# Patient Record
Sex: Female | Born: 1965 | Race: White | Hispanic: No | Marital: Married | Smoking: Never smoker
Health system: Southern US, Community
[De-identification: ages and names within clinical notes are randomized; demographics above are authoritative.]

## PROBLEM LIST (undated history)

## (undated) HISTORY — PX: TONSILLECTOMY: SUR1361

---

## 1997-10-01 ENCOUNTER — Inpatient Hospital Stay (HOSPITAL_COMMUNITY): Admission: AD | Admit: 1997-10-01 | Discharge: 1997-10-01 | Payer: Self-pay | Admitting: *Deleted

## 1997-10-23 ENCOUNTER — Observation Stay (HOSPITAL_COMMUNITY): Admission: AD | Admit: 1997-10-23 | Discharge: 1997-10-24 | Payer: Self-pay | Admitting: Obstetrics and Gynecology

## 1998-02-04 ENCOUNTER — Ambulatory Visit (HOSPITAL_COMMUNITY): Admission: RE | Admit: 1998-02-04 | Discharge: 1998-02-04 | Payer: Self-pay | Admitting: Obstetrics and Gynecology

## 1998-03-21 ENCOUNTER — Inpatient Hospital Stay (HOSPITAL_COMMUNITY): Admission: AD | Admit: 1998-03-21 | Discharge: 1998-03-21 | Payer: Self-pay | Admitting: Obstetrics and Gynecology

## 1998-04-11 ENCOUNTER — Inpatient Hospital Stay (HOSPITAL_COMMUNITY): Admission: AD | Admit: 1998-04-11 | Discharge: 1998-04-15 | Payer: Self-pay | Admitting: Obstetrics and Gynecology

## 1998-04-15 ENCOUNTER — Encounter (HOSPITAL_COMMUNITY): Admission: RE | Admit: 1998-04-15 | Discharge: 1998-06-20 | Payer: Self-pay | Admitting: Obstetrics and Gynecology

## 1998-05-22 ENCOUNTER — Other Ambulatory Visit: Admission: RE | Admit: 1998-05-22 | Discharge: 1998-05-22 | Payer: Self-pay | Admitting: Obstetrics and Gynecology

## 1999-03-28 ENCOUNTER — Inpatient Hospital Stay (HOSPITAL_COMMUNITY): Admission: EM | Admit: 1999-03-28 | Discharge: 1999-03-31 | Payer: Self-pay | Admitting: Emergency Medicine

## 1999-05-25 ENCOUNTER — Other Ambulatory Visit: Admission: RE | Admit: 1999-05-25 | Discharge: 1999-05-25 | Payer: Self-pay | Admitting: Obstetrics and Gynecology

## 2000-06-22 ENCOUNTER — Other Ambulatory Visit: Admission: RE | Admit: 2000-06-22 | Discharge: 2000-06-22 | Payer: Self-pay | Admitting: Obstetrics and Gynecology

## 2000-07-25 ENCOUNTER — Ambulatory Visit (HOSPITAL_COMMUNITY): Admission: RE | Admit: 2000-07-25 | Discharge: 2000-07-25 | Payer: Self-pay | Admitting: Obstetrics and Gynecology

## 2001-01-05 ENCOUNTER — Inpatient Hospital Stay (HOSPITAL_COMMUNITY): Admission: AD | Admit: 2001-01-05 | Discharge: 2001-01-08 | Payer: Self-pay | Admitting: Obstetrics and Gynecology

## 2001-01-05 ENCOUNTER — Encounter (INDEPENDENT_AMBULATORY_CARE_PROVIDER_SITE_OTHER): Payer: Self-pay

## 2001-02-08 ENCOUNTER — Other Ambulatory Visit: Admission: RE | Admit: 2001-02-08 | Discharge: 2001-02-08 | Payer: Self-pay | Admitting: Obstetrics and Gynecology

## 2001-05-10 ENCOUNTER — Encounter: Payer: Self-pay | Admitting: Emergency Medicine

## 2001-05-10 ENCOUNTER — Emergency Department (HOSPITAL_COMMUNITY): Admission: EM | Admit: 2001-05-10 | Discharge: 2001-05-10 | Payer: Self-pay | Admitting: Emergency Medicine

## 2001-06-15 ENCOUNTER — Encounter: Admission: RE | Admit: 2001-06-15 | Discharge: 2001-06-15 | Payer: Self-pay | Admitting: Internal Medicine

## 2001-06-15 ENCOUNTER — Encounter: Payer: Self-pay | Admitting: Internal Medicine

## 2001-11-19 ENCOUNTER — Emergency Department (HOSPITAL_COMMUNITY): Admission: EM | Admit: 2001-11-19 | Discharge: 2001-11-19 | Payer: Self-pay | Admitting: Emergency Medicine

## 2001-11-19 ENCOUNTER — Encounter: Payer: Self-pay | Admitting: Emergency Medicine

## 2002-04-04 ENCOUNTER — Other Ambulatory Visit: Admission: RE | Admit: 2002-04-04 | Discharge: 2002-04-04 | Payer: Self-pay | Admitting: Obstetrics and Gynecology

## 2003-07-02 ENCOUNTER — Other Ambulatory Visit: Admission: RE | Admit: 2003-07-02 | Discharge: 2003-07-02 | Payer: Self-pay | Admitting: Obstetrics and Gynecology

## 2005-01-05 ENCOUNTER — Ambulatory Visit (HOSPITAL_COMMUNITY): Admission: RE | Admit: 2005-01-05 | Discharge: 2005-01-05 | Payer: Self-pay | Admitting: Orthopedic Surgery

## 2005-01-05 ENCOUNTER — Ambulatory Visit (HOSPITAL_BASED_OUTPATIENT_CLINIC_OR_DEPARTMENT_OTHER): Admission: RE | Admit: 2005-01-05 | Discharge: 2005-01-05 | Payer: Self-pay | Admitting: Orthopedic Surgery

## 2005-03-24 ENCOUNTER — Other Ambulatory Visit: Admission: RE | Admit: 2005-03-24 | Discharge: 2005-03-24 | Payer: Self-pay | Admitting: Obstetrics and Gynecology

## 2009-02-25 ENCOUNTER — Encounter: Admission: RE | Admit: 2009-02-25 | Discharge: 2009-02-25 | Payer: Self-pay | Admitting: Internal Medicine

## 2010-12-11 ENCOUNTER — Other Ambulatory Visit: Payer: Self-pay | Admitting: Obstetrics and Gynecology

## 2010-12-11 DIAGNOSIS — R928 Other abnormal and inconclusive findings on diagnostic imaging of breast: Secondary | ICD-10-CM

## 2010-12-15 ENCOUNTER — Ambulatory Visit
Admission: RE | Admit: 2010-12-15 | Discharge: 2010-12-15 | Disposition: A | Discharge status: Home / Self Care | Payer: BC Managed Care – PPO | Source: Ambulatory Visit | Attending: Obstetrics and Gynecology | Admitting: Obstetrics and Gynecology

## 2010-12-15 ENCOUNTER — Other Ambulatory Visit: Payer: Self-pay | Admitting: Obstetrics and Gynecology

## 2010-12-15 DIAGNOSIS — R928 Other abnormal and inconclusive findings on diagnostic imaging of breast: Secondary | ICD-10-CM

## 2010-12-17 ENCOUNTER — Other Ambulatory Visit: Payer: Self-pay | Admitting: Diagnostic Radiology

## 2010-12-17 ENCOUNTER — Ambulatory Visit
Admission: RE | Admit: 2010-12-17 | Discharge: 2010-12-17 | Disposition: A | Discharge status: Home / Self Care | Payer: BC Managed Care – PPO | Source: Ambulatory Visit | Attending: Obstetrics and Gynecology | Admitting: Obstetrics and Gynecology

## 2010-12-17 DIAGNOSIS — R928 Other abnormal and inconclusive findings on diagnostic imaging of breast: Secondary | ICD-10-CM
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
## 2011-01-01 NOTE — H&P (Signed)
[facility]  PatientSABENA, [name]                          MRN: [id number] Adm. Date:  01/05/01 Attending:  Juluis Mire, M.D.                         History and Physical                                Patient is a 45 year old gravida 4, para 2-0-1-2 married white female with last menstrual period of August 26 giving her an estimated date of confinement of June 2 and an estimated gestational age of [redacted] weeks.  This is consistent with initial examination as well as early ultrasound.  She has presented now for repeat cesarean section.  In relation to the present admission the patient has had two prior cesarean sections.  The option of trial of labor had been discussed.  This was declined by the patient desiring repeat cesarean section.  Her prenatal course was also complicated by advanced maternal age.  She underwent amniocentesis with the finding of a chromosomally normal female with normal amniotic fluid, alpha fetoprotein screening.  Her 50 g glucola was also negative along with her Group B strep.  No other complications were noted.  It was noted, however, after spinal anesthesia with her last cesarean section she did have some slowly resolving paraesthesias in the leg.  ALLERGIES:                    ERYTHROMYCIN, SURGICAL TAPE, PEANUT BUTTER.  MEDICATIONS:                  Prenatal vitamins.  PRENATAL LABORATORIES:        Patient is O-.  Negative antibody screen. Nonreactive serology.  Negative hepatitis B surface antigen.  Negative 50 g glucola.  Negative group B strep.  PAST MEDICAL HISTORY:         Please see prenatal records.  FAMILY HISTORY:               Please see prenatal records.  SOCIAL HISTORY:               Please see prenatal records.  PHYSICAL EXAMINATION  VITAL SIGNS:                  Patient is afebrile with stable vital signs.  HEENT:                        Patient normocephalic.  Pupils are equal, round and  reactive to light and accommodation.  Extraocular movements are intact. Sclerae and conjunctivae:  Clear.  Oropharynx:  Clear.  NECK:                         Without thyromegaly.  BREASTS:                      Glandular, but no discreet masses.  LUNGS:                        Clear.  CARDIOVASCULAR:               Regular rhythm, rate.  There is a  grade 2/6 systolic ejection murmur.  No clicks or gallops.  This is felt to be a flow murmur.  ABDOMEN:                      Confirms the gravid uterus consistent with dates.  Fetal heart tones are audible.  PELVIC:                       Cervix is long and closed.  EXTREMITIES:                  Trace edema.  NEUROLOGIC:                   Grossly within normal limits.  IMPRESSION:                   Intrauterine pregnancy at term with prior cesarean section, desires repeat.  PLAN:                         Undergo repeat cesarean section.  The risks of surgery have been discussed including the risk of anesthesia, the risk of infection, the risk of hemorrhage that could necessitate transfusion with the risk of AIDS or hepatitis, the risk of injury to adjacent organs including bladder, bowel, or ureters that could require further exploratory surgery or management, the risk of deep venous thrombosis or pulmonary emboli.  Patient expressed understanding of indications and risks. DD:  01/05/01 TD:  01/05/01 Job: [id number]

## 2011-01-01 NOTE — Discharge Summary (Signed)
[facility]  Patient:    [name], [name]                       MRN: [id number] Adm. Date:  [id number] Disc. Date: [id number] Attending:  Frederich Balding Dictator:   Danie Chandler, R.N.                           Discharge Summary  ADMISSION DIAGNOSES:          1. Intrauterine pregnancy at term with a                                  prior cesarean section, desires repeat.                               2. Multiparity, desires sterility.  DISCHARGE DIAGNOSES:          1. Intrauterine pregnancy at term with a                                  prior cesarean section, desires repeat.                               2. Multiparity, desires sterility.                               3. Anemia.  PROCEDURE:                    On Jan 05, 2001: Repeat low transverse cesarean section and bilateral tubal ligation.  REASON FOR ADMISSION:          Please see dictated H&P.  HOSPITAL COURSE:              The patient was taken to the operating room and underwent the above-named procedure without complication. This was productive of a viable female infant with Apgars of 9 at one minute and 9 at five minutes. Postoperatively, on day #1, the patients hemoglobin was 8.4, hematocrit 24.9, and white blood cell count 10.8. The patient was started on iron daily, had a repeat CBC ordered for the following day. On postoperative day #1, the patient had good control of pain and on postoperative day #2, the patients hemoglobin was stable at 7.9. Her platelets were 149,00 so those were rechecked on the following day. The patient was tolerating a regular diet on this day and ambulating without difficulty. On postoperative day #3, she had a good return of bowel function. Her platelets were 177,000 and hemoglobin was stable at 7.8 and she was discharged home on this day.  CONDITION ON DISCHARGE:       Good.  DIET:                         Regular as tolerated.  ACTIVITY:                      No heavy lifting, no driving, no vaginal entry.  DISCHARGE FOLLOWUP:           She is to follow up  in the office in one to two weeks for incision check and she is to call for temperature greater than 100 degrees, persistent nausea or vomiting, heavy vaginal bleeding, and/or redness or drainage from the incision site.  DISCHARGE MEDICATIONS:        1. Prenatal vitamin one p.o. q.d.                               2. Percocet, #30, as directed by M.D.                               3. Motrin, #30, as directed by M.D.                               4. Nu-Iron, #20, as directed by M.D.DD:  02/06/01 TD:  02/06/01 Job: [id number]

## 2011-01-01 NOTE — Op Note (Signed)
[facility]  Patient:    [name], [name]                       MRN: [id number] Proc. Date: 01/05/01 Adm. Date:  [id number] Attending:  Frederich Balding                           Operative Report  PREOPERATIVE DIAGNOSES:       1. Intrauterine pregnancy at term with prior                                  cesarean section, desires repeat.                               2. Multiparity, desires sterility.  POSTOPERATIVE DIAGNOSES:      1. Intrauterine pregnancy at term with prior                                  cesarean section, desires repeat.                               2. Multiparity, desires sterility.  OPERATION:                    1. Repeat low transverse cesarean section.                               2. Bilateral tubal ligation.  SURGEON:                      Juluis Mire, M.D.  ANESTHESIA:                   Epidural.  ESTIMATED BLOOD LOSS:         800 cc.  PACKS AND DRAINS:             None.  INTRAOPERATIVE BLOOD REPLACED: None.  COMPLICATIONS:                None.  INDICATIONS:                  Were dictated in History & Physical.  In addition, the patient did notify us that she was desirous of permanent sterilization.  The potential irreversibility of sterilization was discussed. We had discussed alternatives.  A failure rate of 1 in 200 was quoted. Failures can be in the form of ectopic pregnancies.  DESCRIPTION OF PROCEDURE:     The patient was taken to the operating room and placed in supine position with left lateral tilt.  After acceptable level of epidural anesthesia was obtained, the abdomen was prepped out with Betadine and draped in sterile field.  A prior low transverse skin incision was identified and excised.  The incision was then extended through subcutaneous tissue.  The fascia was entered sharply, and the incision in the fascia was extended laterally.  The rectus muscles were separated in the midline.   The peritoneum was entered sharply, and the incision in the peritoneum was extended both superiorly and inferiorly.   Some omental adhesions were noted and  taken down using the bipolar.  We had good hemostasis.  Low transverse bladder flap was developed.  Low transverse uterine incision was begun with knife and extended laterally using manual retraction.  The infant was in the vertex presentation and was delivered with elevation of head and fundal pressure.  The infant was a viable female who weighed 6 pounds 14 ounces. Apgars were 8/9, umbilical artery pH was 7.28.  The placenta was then delivered manually.  The uterus was wiped free of remaining membranes and placenta.  The uterus was then exteriorized.  The uterus was was closed with interlocking sutures of 0 chromic using a two layer closure technique.   Some bleeding was noted from the left side of the uterine incision and brought under control with several OLeary stitches of 0 chromic.  Continued ooze was brought under control with some 2-0 chromic.  With this, we had good hemostasis.  Urine output remained clear and adequate.  Both tubes were identified.  The mid segment of tube was elevated with a Babcock.  A hole was made avascular in mesosalpinx using the bipolar.  Individual ligatures of 0 plain catgut were used to ligate off the segment of tube.  The intervening segment of tube was excised.  The cut ends of the tubes were cauterized using the Bovie.  With this, we had good separation of each tube and good hemostasis.  Ovaries were unremarkable.  The uterus was returned to the abdominal cavity.  We had excellent hemostasis and clear urine output.  The muscles were reapproximated with running suture of 3-0 Vicryl.  Fascia was closed with running suture of 0 Panacryl.  Skin was closed with staples and Steri-Strips.  Sponge, instrument, and needle count was reported correct by circulating nurse x 2.  The patient tolerated the  procedure well and was returned to the recovery room in good condition. DD:  01/05/01 TD:  01/05/01 Job: [id number]

## 2011-07-27 ENCOUNTER — Encounter (HOSPITAL_BASED_OUTPATIENT_CLINIC_OR_DEPARTMENT_OTHER): Payer: Self-pay

## 2011-07-27 ENCOUNTER — Emergency Department (INDEPENDENT_AMBULATORY_CARE_PROVIDER_SITE_OTHER): Payer: BC Managed Care – PPO

## 2011-07-27 ENCOUNTER — Emergency Department (HOSPITAL_BASED_OUTPATIENT_CLINIC_OR_DEPARTMENT_OTHER)
Admission: EM | Admit: 2011-07-27 | Discharge: 2011-07-27 | Disposition: A | Discharge status: Home / Self Care | Payer: BC Managed Care – PPO | Attending: Emergency Medicine | Admitting: Emergency Medicine

## 2011-07-27 ENCOUNTER — Other Ambulatory Visit: Payer: Self-pay

## 2011-07-27 DIAGNOSIS — J9819 Other pulmonary collapse: Secondary | ICD-10-CM

## 2011-07-27 DIAGNOSIS — R071 Chest pain on breathing: Secondary | ICD-10-CM

## 2011-07-27 DIAGNOSIS — R079 Chest pain, unspecified: Secondary | ICD-10-CM | POA: Insufficient documentation

## 2011-07-27 DIAGNOSIS — R42 Dizziness and giddiness: Secondary | ICD-10-CM | POA: Insufficient documentation

## 2011-07-27 DIAGNOSIS — M542 Cervicalgia: Secondary | ICD-10-CM

## 2011-07-27 DIAGNOSIS — R0602 Shortness of breath: Secondary | ICD-10-CM | POA: Insufficient documentation

## 2011-07-27 DIAGNOSIS — M79609 Pain in unspecified limb: Secondary | ICD-10-CM

## 2011-07-27 DIAGNOSIS — R0789 Other chest pain: Secondary | ICD-10-CM

## 2011-07-27 LAB — COMPREHENSIVE METABOLIC PANEL
ALT: 10 U/L (ref 0–35)
Alkaline Phosphatase: 85 U/L (ref 39–117)
Chloride: 104 mEq/L (ref 96–112)
GFR calc Af Amer: >90 mL/min (ref >90)
Glucose, Bld: 93 mg/dL (ref 70–99)
Potassium: 3.7 mEq/L (ref 3.5–5.1)
Sodium: 139 mEq/L (ref 135–145)
Total Bilirubin: 0.3 mg/dL (ref 0.3–1.2)
Total Protein: 7.8 g/dL (ref 6.0–8.3)

## 2011-07-27 LAB — CBC
Hemoglobin: 13.8 g/dL (ref 12.0–15.0)
MCHC: 34.2 g/dL (ref 30.0–36.0)
RBC: 4.53 MIL/uL (ref 3.87–5.11)
WBC: 7.8 10*3/uL (ref 4.0–10.5)

## 2011-07-27 MED ORDER — SODIUM CHLORIDE 0.9 % IV SOLN
20.0000 mL | INTRAVENOUS | Status: DC
  Administered 2011-07-27: 20 mL via INTRAVENOUS

## 2011-07-27 MED ORDER — IOHEXOL 350 MG/ML SOLN
80.0000 mL | Freq: Once | INTRAVENOUS | Status: AC | PRN
  Administered 2011-07-27: 80 mL via INTRAVENOUS

## 2011-07-27 MED ORDER — ASPIRIN 81 MG PO CHEW
324.0000 mg | CHEWABLE_TABLET | Freq: Once | ORAL | Status: AC
  Administered 2011-07-27: 324 mg via ORAL
  Filled 2011-07-27: qty 4

## 2011-07-27 MED ORDER — ETODOLAC 300 MG PO CAPS
300.0000 mg | ORAL_CAPSULE | Freq: Three times a day (TID) | ORAL | Status: DC
Start: 2011-07-27 — End: 2011-07-27

## 2011-07-27 MED ORDER — ETODOLAC 300 MG PO CAPS: 300.0000 mg | ORAL_CAPSULE | Freq: Three times a day (TID) | ORAL | Status: AC

## 2011-07-27 NOTE — ED Notes (Signed)
No diatress noted in Pt. And no ectopy noted in cardiac monitor.. SR with 81 rate

## 2011-07-27 NOTE — ED Notes (Signed)
Pt reports onset of left chest wall pain radiating to left side of neck that started yesterday.  Pain has worsened today.

## 2011-07-27 NOTE — ED Provider Notes (Signed)
History     CSN: [id number] Arrival date & time: 07/27/2011  3:19 PM   First MD Initiated Contact with Patient 07/27/11 1622      Chief Complaint  Patient presents with  . Chest Pain    (Consider location/radiation/quality/duration/timing/severity/associated sxs/prior treatment) HPI Comments: The pain is on the upper left and goes up toward the neck.  Patient is a 45 y.o. female presenting with chest pain. The history is provided by the patient.  Chest Pain The chest pain began yesterday. Chest pain occurs constantly. The chest pain is worsening. The pain is associated with breathing and exertion. The severity of the pain is moderate. The quality of the pain is described as pleuritic, sharp and pressure-like. Chest pain is worsened by deep breathing. Primary symptoms include shortness of breath and dizziness. Pertinent negatives for primary symptoms include no fever, no cough, no palpitations and no vomiting.  Dizziness does not occur with vomiting, weakness or diaphoresis.   Pertinent negatives for associated symptoms include no diaphoresis, no numbness and no weakness. She tried aspirin for the symptoms. Risk factors include no known risk factors.  Pertinent negatives for past medical history include no CAD.  Her family medical history is significant for CAD in family.  Pertinent negatives for family medical history include: no early MI in family and no PE in family.     History reviewed. No pertinent past medical history.  Past Surgical History  Procedure Date  . Cesarean section     No family history on file.  History  Substance Use Topics  . Smoking status: Never Smoker   . Smokeless tobacco: Not on file  . Alcohol Use: Yes     occasional    OB History    Grav Para Term Preterm Abortions TAB SAB Ect Mult Living                  Review of Systems  Constitutional: Negative for fever and diaphoresis.  Respiratory: Positive for shortness of breath. Negative for  cough.   Cardiovascular: Positive for chest pain. Negative for palpitations.  Gastrointestinal: Negative for vomiting.  Neurological: Positive for dizziness. Negative for weakness and numbness.  All other systems reviewed and are negative.    Allergies  Erythromycin; Peanut-containing drug products; and Tape  Home Medications   Current Outpatient Rx  Name Route Sig Dispense Refill  . ASPIRIN EC 81 MG PO TBEC Oral Take 162 mg by mouth daily.        BP 117/77  Pulse 78  Temp(Src) 98.6 F (37 C) (Oral)  Resp 18  Ht 5\' 5"  (1.651 m)  Wt 206 lb (93.441 kg)  BMI 34.28 kg/m2  SpO2 100%  LMP 07/20/2011  Physical Exam  Nursing note and vitals reviewed. Constitutional: She appears well-developed and well-nourished. No distress.  HENT:  Head: Normocephalic and atraumatic.  Right Ear: External ear normal.  Left Ear: External ear normal.  Eyes: Conjunctivae are normal. Right eye exhibits no discharge. Left eye exhibits no discharge. No scleral icterus.  Neck: Neck supple. No tracheal deviation present.  Cardiovascular: Normal rate, regular rhythm and intact distal pulses.   Pulmonary/Chest: Effort normal and breath sounds normal. No stridor. No respiratory distress. She has no wheezes. She has no rales.  Abdominal: Soft. Bowel sounds are normal. She exhibits no distension. There is tenderness (left chest wall ttp). There is no rebound and no guarding.  Musculoskeletal: She exhibits no edema and no tenderness.  Neurological: She is alert. She has  normal strength. No sensory deficit. Cranial nerve deficit:  no gross defecits noted. She exhibits normal muscle tone. She displays no seizure activity. Coordination normal.  Skin: Skin is warm and dry. No rash noted.  Psychiatric: She has a normal mood and affect.    ED Course  Procedures (including critical care time)  Date: 07/27/2011  Rate: 78  Rhythm: normal sinus rhythm  QRS Axis: normal  Intervals: normal  ST/T Wave  abnormalities: normal  Conduction Disutrbances:none  Narrative Interpretation:   Old EKG Reviewed: none available   Labs Reviewed  COMPREHENSIVE METABOLIC PANEL - Abnormal; Notable for the following:    GFR calc non Af Amer 88 (*)    All other components within normal limits  D-DIMER, QUANTITATIVE - Abnormal; Notable for the following:    D-Dimer, Quant 0.49 (*)    All other components within normal limits  CBC  TROPONIN I   Dg Chest 2 View  07/27/2011  *RADIOLOGY REPORT*  Clinical Data: Left-sided chest pain radiates into the left side of neck and into the left arm.  CHEST - 2 VIEW  Comparison: None.  Findings: Heart size is normal.  There is perihilar atelectasis/infiltrate and bronchitic change.  Left lower lobe atelectasis is present.  There are no focal consolidations or pleural effusions. Visualized osseous structures have a normal appearance.  IMPRESSION: Findings are consistent with viral pneumonitis.  Left lower lobe atelectasis.  Original Report Authenticated By: Patterson Hammersmith, M.D.   Ct Angio Chest W/cm &/or Wo Cm  07/27/2011  *RADIOLOGY REPORT*  Clinical Data:  Left-sided chest pain.  Elevated D-dimer.  CT ANGIOGRAPHY CHEST WITH CONTRAST  Technique:  Multidetector CT imaging of the chest was performed using the standard protocol during bolus administration of intravenous contrast.  Multiplanar CT image reconstructions including MIPs were obtained to evaluate the vascular anatomy.  Contrast: 80mL OMNIPAQUE IOHEXOL 350 MG/ML IV SOLN  Comparison:  plain films earlier today.  Findings:  No filling defects in the pulmonary arteries to suggest pulmonary emboli. Heart is normal size. Aorta is normal caliber. No mediastinal, hilar, or axillary adenopathy.  Slight central peribronchial thickening.  Scattered ground-glass opacities noted in the lingula and dependent lower lobes, presumably atelectasis. No pleural effusions.  Imaging into the upper abdomen shows no acute findings.   Visualized thyroid and chest wall soft tissues unremarkable.  Review of the MIP images confirms the above findings.  IMPRESSION: No evidence of pulmonary embolus.  Slight central peribronchial thickening. Scattered areas of atelectasis.  Original Report Authenticated By: Cyndie Chime, M.D.     1. Chest pain       MDM  Patient is low risk for coronary artery disease. Her symptoms are atypical. The CT scan does not show any signs of pulmonary embolus, dissection.  The symptoms may be related to pleurisy. Patient has been given a prescription for a nonsteroidal anti-inflammatory. She is to follow up with her primary care Dr. for recheck within the next week. Patient has been encouraged to return emergently for recurrent symptoms        Celene Kras, MD 07/28/11 [phone]

## 2011-07-27 NOTE — ED Notes (Signed)
No resp. Distress noted and no other distress noted by pt.

## 2013-03-04 IMAGING — CT CT ANGIO CHEST
2 of 6 series · 19 of 36 positions shown · IV contrast (APPLIED)
Comparison: plain films earlier today.

CLINICAL DATA: Left-sided chest pain.  Elevated D-dimer.

CT ANGIOGRAPHY CHEST WITH CONTRAST
TECHNIQUE: Multidetector CT imaging of the chest was performed
using the standard protocol during bolus administration of
intravenous contrast.  Multiplanar CT image reconstructions
including MIPs were obtained to evaluate the vascular anatomy.
Contrast: 80mL OMNIPAQUE IOHEXOL 350 MG/ML IV SOLN

[Series 5: pe 1.0 b25f · axial · 0.58mm/px · z∈[+10,+176]mm · 18 of 186 slices shown]
[im 10/186  lung]
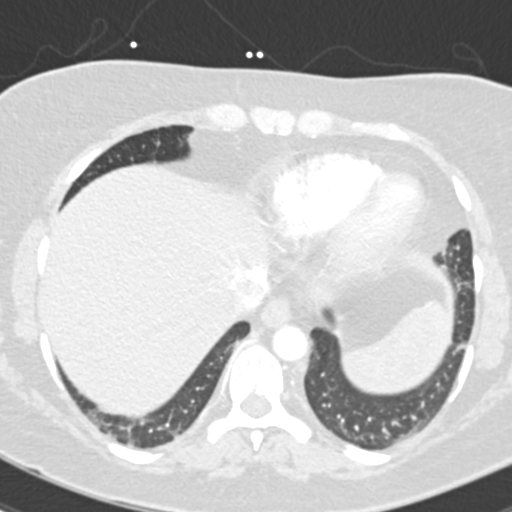
[im 19/186  mediastinal]
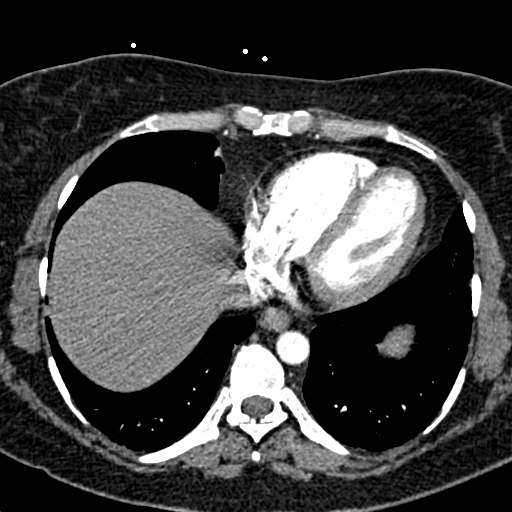
[im 28/186  lung]
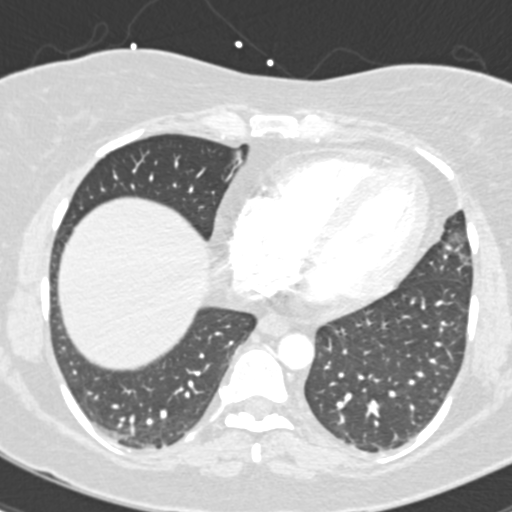
[im 38/186  mediastinal]
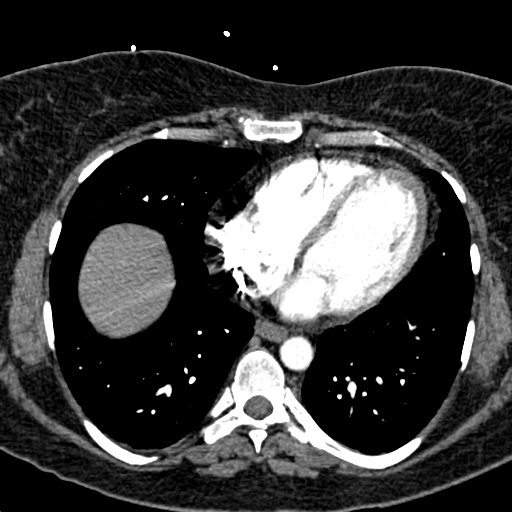
[im 47/186  lung]
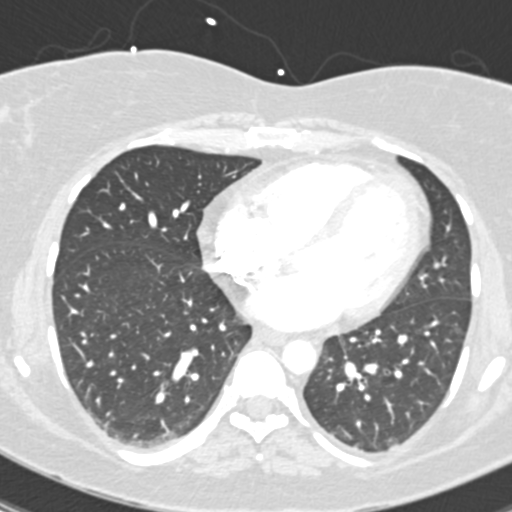
[im 56/186  mediastinal]
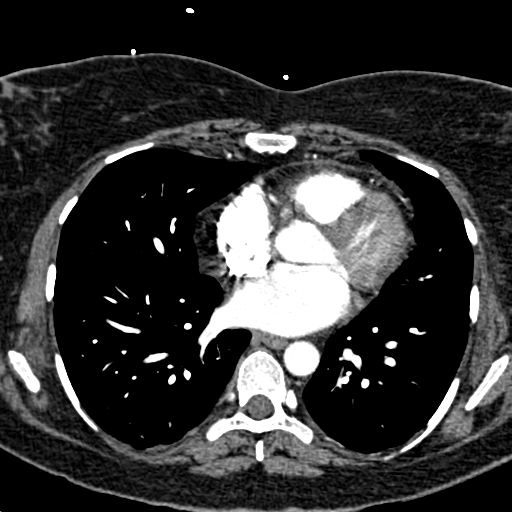
[im 65/186  lung]
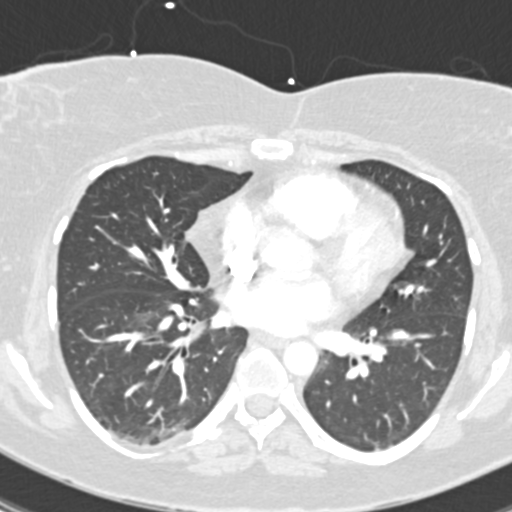
[im 75/186  mediastinal]
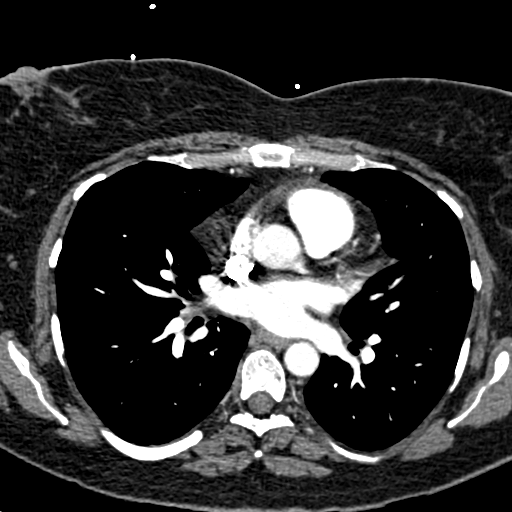
[im 84/186  lung]
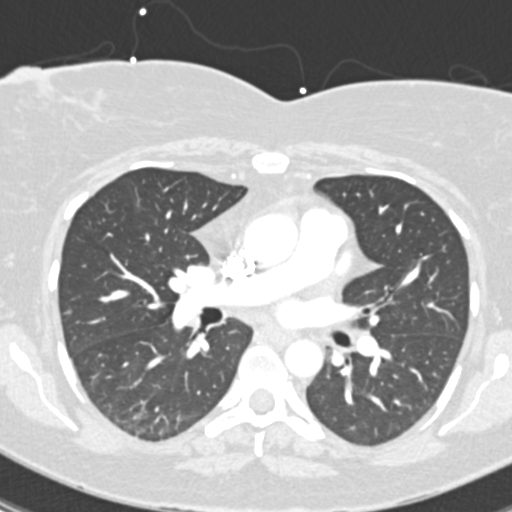
[im 102/186  mediastinal]
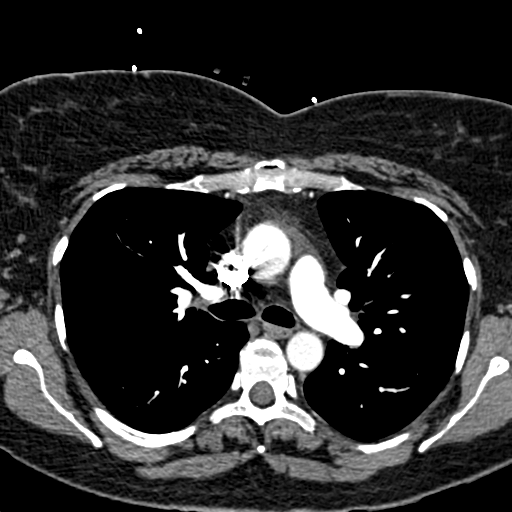
[im 112/186  lung]
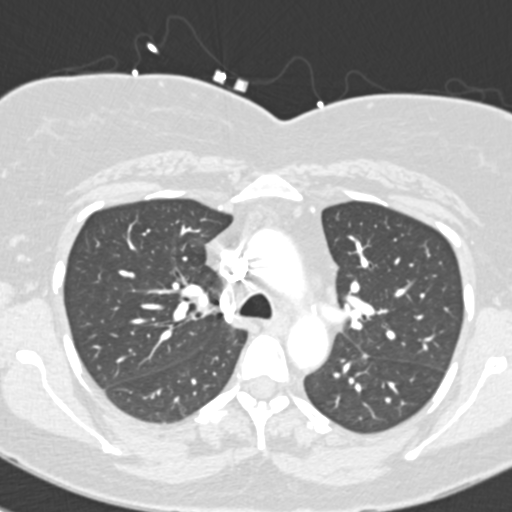
[im 121/186  mediastinal]
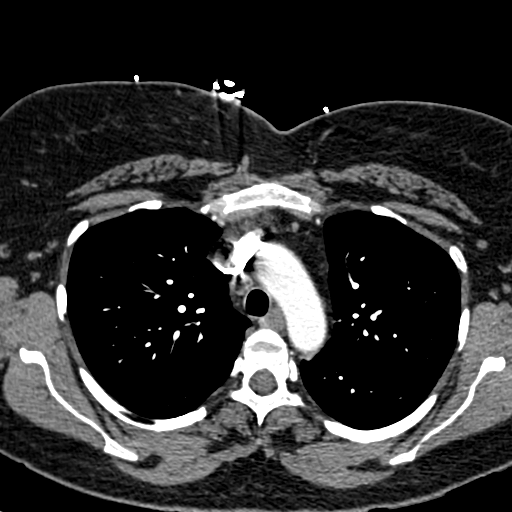
[im 130/186  lung]
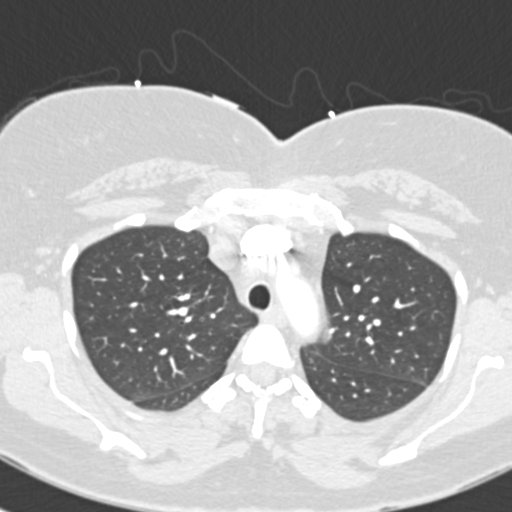
[im 139/186  mediastinal]
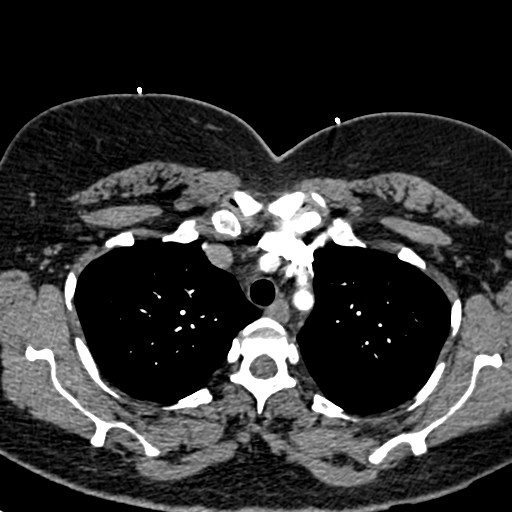
[im 149/186  lung]
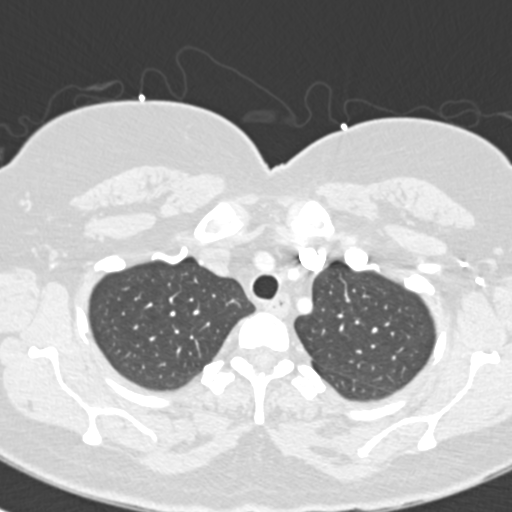
[im 158/186  mediastinal]
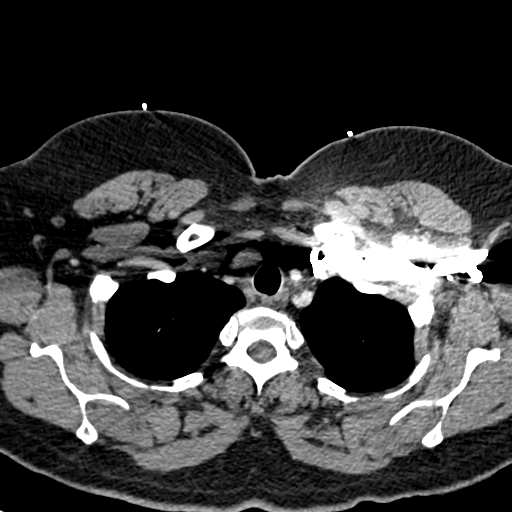
[im 167/186  lung]
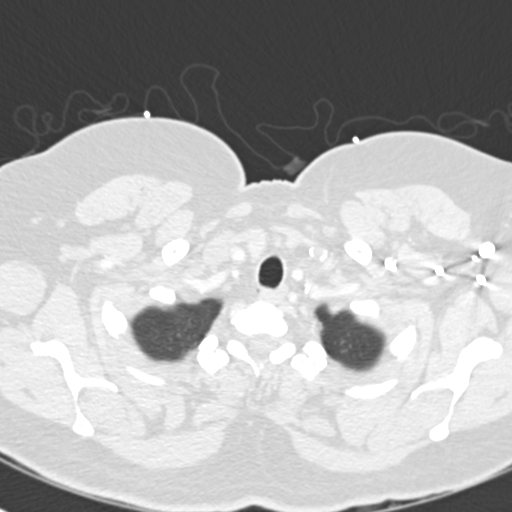
[im 176/186  mediastinal]
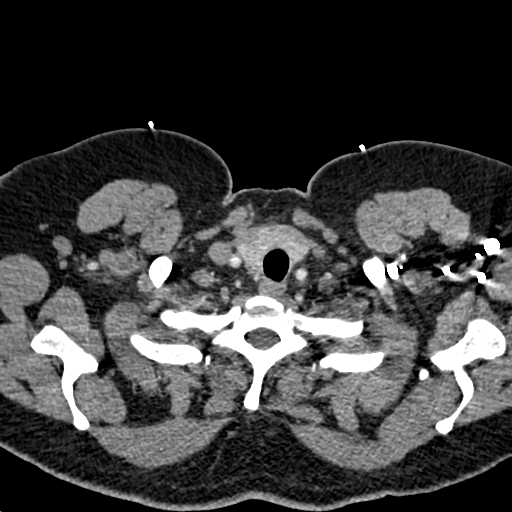

[Series 8: pe 2.0 coronal · coronal · 0.37mm/px · 1 of 115 slices shown]
[im 58/115  mediastinal]
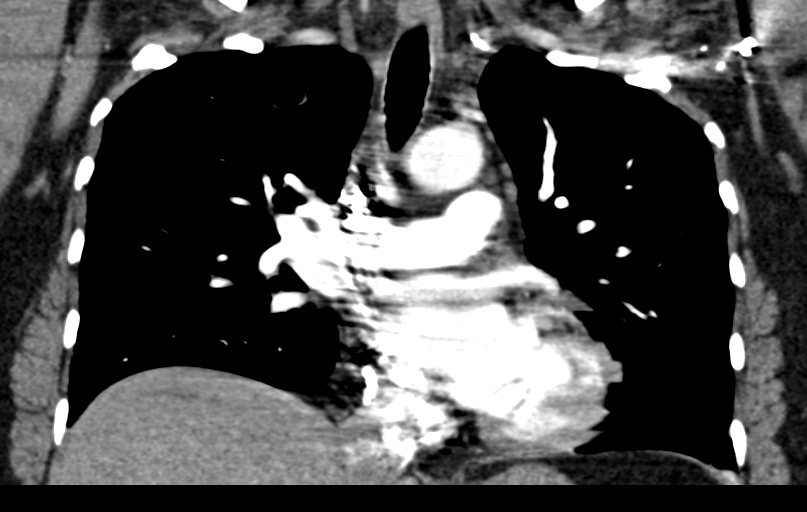

[19 of 36 positions shown; findings below may reference images not displayed]

FINDINGS: No filling defects in the pulmonary arteries to suggest
pulmonary emboli. Heart is normal size. Aorta is normal caliber. No
mediastinal, hilar, or axillary adenopathy.  Slight central
peribronchial thickening.  Scattered ground-glass opacities noted
in the lingula and dependent lower lobes, presumably atelectasis.
No pleural effusions.

Imaging into the upper abdomen shows no acute findings.  Visualized
thyroid and chest wall soft tissues unremarkable.

Review of the MIP images confirms the above findings.
IMPRESSION: No evidence of pulmonary embolus.

Slight central peribronchial thickening. Scattered areas of
atelectasis.

## 2014-06-27 ENCOUNTER — Encounter (HOSPITAL_BASED_OUTPATIENT_CLINIC_OR_DEPARTMENT_OTHER): Payer: Self-pay | Admitting: *Deleted

## 2014-06-27 ENCOUNTER — Emergency Department (HOSPITAL_BASED_OUTPATIENT_CLINIC_OR_DEPARTMENT_OTHER)
Admission: EM | Admit: 2014-06-27 | Discharge: 2014-06-27 | Disposition: A | Discharge status: Home / Self Care | Payer: Worker's Compensation | Attending: Emergency Medicine | Admitting: Emergency Medicine

## 2014-06-27 ENCOUNTER — Emergency Department (HOSPITAL_BASED_OUTPATIENT_CLINIC_OR_DEPARTMENT_OTHER): Payer: Worker's Compensation

## 2014-06-27 DIAGNOSIS — W050XXA Fall from non-moving wheelchair, initial encounter: Secondary | ICD-10-CM | POA: Insufficient documentation

## 2014-06-27 DIAGNOSIS — Y9389 Activity, other specified: Secondary | ICD-10-CM | POA: Diagnosis not present

## 2014-06-27 DIAGNOSIS — S39012A Strain of muscle, fascia and tendon of lower back, initial encounter: Secondary | ICD-10-CM | POA: Diagnosis not present

## 2014-06-27 DIAGNOSIS — W19XXXA Unspecified fall, initial encounter: Secondary | ICD-10-CM

## 2014-06-27 DIAGNOSIS — Z7982 Long term (current) use of aspirin: Secondary | ICD-10-CM | POA: Diagnosis not present

## 2014-06-27 DIAGNOSIS — S46911A Strain of unspecified muscle, fascia and tendon at shoulder and upper arm level, right arm, initial encounter: Secondary | ICD-10-CM

## 2014-06-27 DIAGNOSIS — Y998 Other external cause status: Secondary | ICD-10-CM | POA: Diagnosis not present

## 2014-06-27 DIAGNOSIS — S50811A Abrasion of right forearm, initial encounter: Secondary | ICD-10-CM | POA: Insufficient documentation

## 2014-06-27 DIAGNOSIS — Y9289 Other specified places as the place of occurrence of the external cause: Secondary | ICD-10-CM | POA: Insufficient documentation

## 2014-06-27 DIAGNOSIS — Z791 Long term (current) use of non-steroidal anti-inflammatories (NSAID): Secondary | ICD-10-CM | POA: Insufficient documentation

## 2014-06-27 DIAGNOSIS — S3992XA Unspecified injury of lower back, initial encounter: Secondary | ICD-10-CM | POA: Diagnosis present

## 2014-06-27 MED ORDER — HYDROCODONE-ACETAMINOPHEN 5-325 MG PO TABS
1.0000 | ORAL_TABLET | Freq: Once | ORAL | Status: DC
  Filled 2014-06-27: qty 1

## 2014-06-27 MED ORDER — IBUPROFEN 800 MG PO TABS
800.0000 mg | ORAL_TABLET | Freq: Once | ORAL | Status: AC
  Administered 2014-06-27: 800 mg via ORAL
  Filled 2014-06-27: qty 1

## 2014-06-27 MED ORDER — OXYCODONE-ACETAMINOPHEN 5-325 MG PO TABS: 2.0000 | ORAL_TABLET | ORAL | Status: AC | PRN

## 2014-06-27 MED ORDER — HYDROCODONE-ACETAMINOPHEN 5-325 MG PO TABS: 2.0000 | ORAL_TABLET | ORAL | Status: DC | PRN

## 2014-06-27 MED ORDER — IBUPROFEN 800 MG PO TABS: 800.0000 mg | ORAL_TABLET | Freq: Three times a day (TID) | ORAL | Status: AC

## 2014-06-27 MED ORDER — CYCLOBENZAPRINE HCL 10 MG PO TABS: 10.0000 mg | ORAL_TABLET | Freq: Two times a day (BID) | ORAL | Status: AC | PRN
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
## 2014-06-27 NOTE — ED Provider Notes (Signed)
CSN: [id number]     Arrival date & time 06/27/14  1016 History   First MD Initiated Contact with Patient 06/27/14 1152     Chief Complaint  Patient presents with  . Fall     (Consider location/radiation/quality/duration/timing/severity/associated sxs/prior Treatment) HPI  The patient went to sit in a chair and it slipped out from behind her. She fell backwards trying to catch herself with her right arm on a windowsill. She ended up landing on her right flank/buttock area. She port initially she didn't think the injury was too bad. However when she tried to stand up she had a lot of pain in her right lower back. She denies numbness or tingling into the leg. She denies weakness of the leg.she was her right shoulder hurts when she extends her arm backwards. There is no numbness or tingling or dysfunction of right upper shoulder. She did not hit her head or get knocked out.  History reviewed. No pertinent past medical history. Past Surgical History  Procedure Laterality Date  . Cesarean section     History reviewed. No pertinent family history. History  Substance Use Topics  . Smoking status: Never Smoker   . Smokeless tobacco: Not on file  . Alcohol Use: Yes     Comment: occasional   OB History    No data available     Review of Systems  10 Systems reviewed and are negative for acute change except as noted in the HPI.   Allergies  Erythromycin; Peanut-containing drug products; and Tape  Home Medications   Prior to Admission medications   Medication Sig Start Date End Date Taking? Authorizing Provider  meloxicam (MOBIC) 15 MG tablet Take 15 mg by mouth daily.   Yes Historical Provider, MD  aspirin EC 81 MG tablet Take 162 mg by mouth daily.      Historical Provider, MD  cyclobenzaprine (FLEXERIL) 10 MG tablet Take 1 tablet (10 mg total) by mouth 2 (two) times daily as needed for muscle spasms. 06/27/14   Arby BarretteMarcy Moises Terpstra, MD  ibuprofen (ADVIL,MOTRIN) 800 MG tablet Take 1  tablet (800 mg total) by mouth 3 (three) times daily. 06/27/14   Arby BarretteMarcy Samhitha Rosen, MD  oxyCODONE-acetaminophen (PERCOCET/ROXICET) 5-325 MG per tablet Take 2 tablets by mouth every 4 (four) hours as needed for severe pain. 06/27/14   Arby BarretteMarcy Brystal Kildow, MD   BP 127/85 mmHg  Pulse 72  Temp(Src) 98.3 F (36.8 C) (Oral)  Resp 18  Ht 5\' 5"  (1.651 m)  Wt 220 lb (99.791 kg)  BMI 36.61 kg/m2  SpO2 100%  LMP 02/13/2014 (Approximate) Physical Exam  Constitutional: She is oriented to person, place, and time. She appears well-developed and well-nourished.  HENT:  Head: Normocephalic and atraumatic.  Eyes: EOM are normal. Pupils are equal, round, and reactive to light.  Neck: Neck supple.  Cardiovascular: Normal rate, regular rhythm, normal heart sounds and intact distal pulses.   Pulmonary/Chest: Effort normal and breath sounds normal.  Abdominal: Soft. Bowel sounds are normal. She exhibits no distension. There is no tenderness.  Musculoskeletal: She exhibits no edema.  Patient has pain with extension of the right shoulder and external rotation. She however has intact range of motion without any locking or clicking at the joint. She is able to spot Pylesvilleasey go through range of motion. Grip strength is excellent. There is a focus of tenderness on the lumbar back around the L4/5 area. No appreciable palpable abnormality. Lower extremity strength is 5 out of 5 in flexion and extension.  Neurological: She is alert and oriented to person, place, and time. She has normal strength. Coordination normal. GCS eye subscore is 4. GCS verbal subscore is 5. GCS motor subscore is 6.  Skin: Skin is warm, dry and intact.  The patient has a minor superficial abrasion to the volar aspect of her right forearm that is approximately 10 cm in length with no associated bleeding or discharge.  Psychiatric: She has a normal mood and affect.    ED Course  Procedures (including critical care time) Labs Review Labs Reviewed - No  data to display  Imaging Review Dg Lumbar Spine Complete  06/27/2014   CLINICAL DATA:  Fall with low back pain.  Initial encounter  EXAM: LUMBAR SPINE - COMPLETE 4+ VIEW  COMPARISON:  None.  FINDINGS: No acute fracture or malalignment.  Minimal spondylotic spurring with lower thoracic and upper lumbar disc narrowing.  IMPRESSION: No acute osseous findings.   Electronically Signed   By: Tiburcio PeaJonathan  Watts M.D.   On: 06/27/2014 12:50     EKG Interpretation None      MDM   Final diagnoses:  Fall  Lumbar strain, initial encounter  Shoulder strain, right, initial encounter   At this point patient does not have any associated neurologic symptoms or dysfunction to suggest disc herniation with compression. Findings are consistent with lumbar strain versus possible disc herniation without nerve impingement. Shoulder findings are consistent with partial rotator cuff strain type injury. All function of the upper extremity is intact with a normal neurovascular examination. The patient be treated for pain control with recommended follow-up and recheck with her family physician.    Arby BarretteMarcy Alyssandra Hulsebus, MD 06/27/14 1340

## 2014-06-27 NOTE — Discharge Instructions (Signed)
Rotator Cuff Injury °Rotator cuff injury is any type of injury to the set of muscles and tendons that make up the stabilizing unit of your shoulder. This unit holds the ball of your upper arm bone (humerus) in the socket of your shoulder blade (scapula).  °CAUSES °Injuries to your rotator cuff most commonly come from sports or activities that cause your arm to be moved repeatedly over your head. Examples of this include throwing, weight lifting, swimming, or racquet sports. Long lasting (chronic) irritation of your rotator cuff can cause soreness and swelling (inflammation), bursitis, and eventual damage to your tendons, such as a tear (rupture). °SIGNS AND SYMPTOMS °Acute rotator cuff tear: °· Sudden tearing sensation followed by severe pain shooting from your upper shoulder down your arm toward your elbow. °· Decreased range of motion of your shoulder because of pain and muscle spasm. °· Severe pain. °· Inability to raise your arm out to the side because of pain and loss of muscle power (large tears). °Chronic rotator cuff tear: °· Pain that usually is worse at night and may interfere with sleep. °· Gradual weakness and decreased shoulder motion as the pain worsens. °· Decreased range of motion. °Rotator cuff tendinitis:  °· Deep ache in your shoulder and the outside upper arm over your shoulder. °· Pain that comes on gradually and becomes worse when lifting your arm to the side or turning it inward. °DIAGNOSIS °Rotator cuff injury is diagnosed through a medical history, physical exam, and imaging exam. The medical history helps determine the type of rotator cuff injury. Your health care provider will look at your injured shoulder, feel the injured area, and ask you to move your shoulder in different positions. X-ray exams typically are done to rule out other causes of shoulder pain, such as fractures. MRI is the exam of choice for the most severe shoulder injuries because the images show muscles and tendons.    °TREATMENT  °Chronic tear: °· Medicine for pain, such as acetaminophen or ibuprofen. °· Physical therapy and range-of-motion exercises may be helpful in maintaining shoulder function and strength. °· Steroid injections into your shoulder joint. °· Surgical repair of the rotator cuff if the injury does not heal with noninvasive treatment. °Acute tear: °· Anti-inflammatory medicines such as ibuprofen and naproxen to help reduce pain and swelling. °· A sling to help support your arm and rest your rotator cuff muscles. Long-term use of a sling is not advised. It may cause significant stiffening of the shoulder joint. °· Surgery may be considered within a few weeks, especially in younger, active people, to return the shoulder to full function. °· Indications for surgical treatment include the following: °¨ Age younger than 60 years. °¨ Rotator cuff tears that are complete. °¨ Physical therapy, rest, and anti-inflammatory medicines have been used for 6-8 weeks, with no improvement. °¨ Employment or sporting activity that requires constant shoulder use. °Tendinitis: °· Anti-inflammatory medicines such as ibuprofen and naproxen to help reduce pain and swelling. °· A sling to help support your arm and rest your rotator cuff muscles. Long-term use of a sling is not advised. It may cause significant stiffening of the shoulder joint. °· Severe tendinitis may require: °¨ Steroid injections into your shoulder joint. °¨ Physical therapy. °¨ Surgery. °HOME CARE INSTRUCTIONS  °· Apply ice to your injury: °¨ Put ice in a plastic bag. °¨ Place a towel between your skin and the bag. °¨ Leave the ice on for 20 minutes, 2-3 times a day. °· If you   have a shoulder immobilizer (sling and straps), wear it until told otherwise by your health care provider.  You may want to sleep on several pillows or in a recliner at night to lessen swelling and pain.  Only take over-the-counter or prescription medicines for pain, discomfort, or fever as  directed by your health care provider.  Do simple hand squeezing exercises with a soft rubber ball to decrease hand swelling. SEEK MEDICAL CARE IF:   Your shoulder pain increases, or new pain or numbness develops in your arm, hand, or fingers.  Your hand or fingers are colder than your other hand. SEEK IMMEDIATE MEDICAL CARE IF:   Your arm, hand, or fingers are numb or tingling.  Your arm, hand, or fingers are increasingly swollen and painful, or they turn white or blue. MAKE SURE YOU:  Understand these instructions.  Will watch your condition.  Will get help right away if you are not doing well or get worse. Document Released: 07/30/2000 Document Revised: 08/07/2013 Document Reviewed: 03/14/2013 [facility] Patient Information 2015 [place], [place]. This information is not intended to replace advice given to you by your health care provider. Make sure you discuss any questions you have with your health care provider. Lumbosacral Strain Lumbosacral strain is a strain of any of the parts that make up your lumbosacral vertebrae. Your lumbosacral vertebrae are the bones that make up the lower third of your backbone. Your lumbosacral vertebrae are held together by muscles and tough, fibrous tissue (ligaments).  CAUSES  A sudden blow to your back can cause lumbosacral strain. Also, anything that causes an excessive stretch of the muscles in the low back can cause this strain. This is typically seen when people exert themselves strenuously, fall, lift heavy objects, bend, or crouch repeatedly. RISK FACTORS  Physically demanding work.  Participation in pushing or pulling sports or sports that require a sudden twist of the back (tennis, golf, baseball).  Weight lifting.  Excessive lower back curvature.  Forward-tilted pelvis.  Weak back or abdominal muscles or both.  Tight hamstrings. SIGNS AND SYMPTOMS  Lumbosacral strain may cause pain in the area of your injury or pain that moves  (radiates) down your leg.  DIAGNOSIS Your health care provider can often diagnose lumbosacral strain through a physical exam. In some cases, you may need tests such as X-ray exams.  TREATMENT  Treatment for your lower back injury depends on many factors that your clinician will have to evaluate. However, most treatment will include the use of anti-inflammatory medicines. HOME CARE INSTRUCTIONS   Avoid hard physical activities (tennis, racquetball, waterskiing) if you are not in proper physical condition for it. This may aggravate or create problems.  If you have a back problem, avoid sports requiring sudden body movements. Swimming and walking are generally safer activities.  Maintain good posture.  Maintain a healthy weight.  For acute conditions, you may put ice on the injured area.  Put ice in a plastic bag.  Place a towel between your skin and the bag.  Leave the ice on for 20 minutes, 2-3 times a day.  When the low back starts healing, stretching and strengthening exercises may be recommended. SEEK MEDICAL CARE IF:  Your back pain is getting worse.  You experience severe back pain not relieved with medicines. SEEK IMMEDIATE MEDICAL CARE IF:   You have numbness, tingling, weakness, or problems with the use of your arms or legs.  There is a change in bowel or bladder control.  You have increasing  pain in any area of the body, including your belly (abdomen).  You notice shortness of breath, dizziness, or feel faint.  You feel sick to your stomach (nauseous), are throwing up (vomiting), or become sweaty.  You notice discoloration of your toes or legs, or your feet get very cold. MAKE SURE YOU:   Understand these instructions.  Will watch your condition.  Will get help right away if you are not doing well or get worse. Document Released: 05/12/2005 Document Revised: 08/07/2013 Document Reviewed: 03/21/2013 [facility] Patient Information 2015 [place], [place]. This  information is not intended to replace advice given to you by your health care provider. Make sure you discuss any questions you have with your health care provider.

## 2014-09-20 ENCOUNTER — Encounter (HOSPITAL_BASED_OUTPATIENT_CLINIC_OR_DEPARTMENT_OTHER): Payer: Self-pay

## 2014-09-20 ENCOUNTER — Emergency Department (HOSPITAL_BASED_OUTPATIENT_CLINIC_OR_DEPARTMENT_OTHER)
Admission: EM | Admit: 2014-09-20 | Discharge: 2014-09-20 | Disposition: A | Discharge status: Home / Self Care | Payer: BC Managed Care – PPO | Attending: Emergency Medicine | Admitting: Emergency Medicine

## 2014-09-20 DIAGNOSIS — Z7982 Long term (current) use of aspirin: Secondary | ICD-10-CM | POA: Insufficient documentation

## 2014-09-20 DIAGNOSIS — N390 Urinary tract infection, site not specified: Secondary | ICD-10-CM | POA: Insufficient documentation

## 2014-09-20 DIAGNOSIS — Z791 Long term (current) use of non-steroidal anti-inflammatories (NSAID): Secondary | ICD-10-CM | POA: Diagnosis not present

## 2014-09-20 DIAGNOSIS — Z79899 Other long term (current) drug therapy: Secondary | ICD-10-CM | POA: Diagnosis not present

## 2014-09-20 DIAGNOSIS — R55 Syncope and collapse: Secondary | ICD-10-CM | POA: Diagnosis present

## 2014-09-20 LAB — URINALYSIS, ROUTINE W REFLEX MICROSCOPIC
Bilirubin Urine: NEGATIVE
GLUCOSE, UA: NEGATIVE mg/dL
Hgb urine dipstick: NEGATIVE
Ketones, ur: NEGATIVE mg/dL
Nitrite: NEGATIVE
PH: 7.5 (ref 5.0–8.0)
PROTEIN: NEGATIVE mg/dL
Specific Gravity, Urine: 1.024 (ref 1.005–1.030)
Urobilinogen, UA: 0.2 mg/dL (ref 0.0–1.0)

## 2014-09-20 LAB — COMPREHENSIVE METABOLIC PANEL
ALT: 18 U/L (ref 0–35)
AST: 23 U/L (ref 0–37)
Albumin: 4.1 g/dL (ref 3.5–5.2)
Alkaline Phosphatase: 84 U/L (ref 39–117)
Anion gap: 5 (ref 5–15)
BUN: 16 mg/dL (ref 6–23)
CALCIUM: 9.2 mg/dL (ref 8.4–10.5)
CO2: 23 mmol/L (ref 19–32)
Chloride: 109 mmol/L (ref 96–112)
Creatinine, Ser: 0.69 mg/dL (ref 0.50–1.10)
GLUCOSE: 112 mg/dL — AB (ref 70–99)
Potassium: 3.4 mmol/L — ABNORMAL LOW (ref 3.5–5.1)
SODIUM: 137 mmol/L (ref 135–145)
TOTAL PROTEIN: 7.6 g/dL (ref 6.0–8.3)
Total Bilirubin: 0.6 mg/dL (ref 0.3–1.2)

## 2014-09-20 LAB — CBC WITH DIFFERENTIAL/PLATELET
Basophils Absolute: 0 10*3/uL (ref 0.0–0.1)
Basophils Relative: 0 % (ref 0–1)
Eosinophils Absolute: 0.1 10*3/uL (ref 0.0–0.7)
Eosinophils Relative: 1 % (ref 0–5)
HCT: 42.1 % (ref 36.0–46.0)
HEMOGLOBIN: 14.2 g/dL (ref 12.0–15.0)
Lymphocytes Relative: 40 % (ref 12–46)
Lymphs Abs: 2.4 10*3/uL (ref 0.7–4.0)
MCH: 30 pg (ref 26.0–34.0)
MCHC: 33.7 g/dL (ref 30.0–36.0)
MCV: 88.8 fL (ref 78.0–100.0)
Monocytes Absolute: 0.7 10*3/uL (ref 0.1–1.0)
Monocytes Relative: 11 % (ref 3–12)
NEUTROS ABS: 2.9 10*3/uL (ref 1.7–7.7)
Neutrophils Relative %: 48 % (ref 43–77)
PLATELETS: 191 10*3/uL (ref 150–400)
RBC: 4.74 MIL/uL (ref 3.87–5.11)
RDW: 12.9 % (ref 11.5–15.5)
WBC: 6 10*3/uL (ref 4.0–10.5)

## 2014-09-20 LAB — URINE MICROSCOPIC-ADD ON

## 2014-09-20 LAB — I-STAT CG4 LACTIC ACID, ED: Lactic Acid, Venous: 1.43 mmol/L (ref 0.5–2.0)

## 2014-09-20 LAB — CBG MONITORING, ED: Glucose-Capillary: 109 mg/dL — ABNORMAL HIGH (ref 70–99)

## 2014-09-20 MED ORDER — CEFTRIAXONE SODIUM 1 G IJ SOLR
INTRAMUSCULAR | Status: AC
  Filled 2014-09-20: qty 10

## 2014-09-20 MED ORDER — DEXTROSE 5 % IV SOLN
1.0000 g | Freq: Once | INTRAVENOUS | Status: AC
  Administered 2014-09-20: 1 g via INTRAVENOUS

## 2014-09-20 MED ORDER — SODIUM CHLORIDE 0.9 % IV SOLN
Freq: Once | INTRAVENOUS | Status: AC
  Administered 2014-09-20: 12:00:00 via INTRAVENOUS

## 2014-09-20 MED ORDER — CEFPODOXIME PROXETIL 100 MG PO TABS: 100.0000 mg | ORAL_TABLET | Freq: Two times a day (BID) | ORAL | Status: AC

## 2014-09-20 MED ORDER — SODIUM CHLORIDE 0.9 % IV BOLUS (SEPSIS)
1000.0000 mL | Freq: Once | INTRAVENOUS | Status: AC
Start: 2014-09-20 — End: 2014-09-20
  Administered 2014-09-20: 1000 mL via INTRAVENOUS

## 2014-09-20 MED ORDER — PROMETHAZINE HCL 25 MG PO TABS: 25.0000 mg | ORAL_TABLET | Freq: Four times a day (QID) | ORAL | Status: AC | PRN

## 2014-09-20 MED ORDER — ACETAMINOPHEN 325 MG PO TABS
650.0000 mg | ORAL_TABLET | Freq: Once | ORAL | Status: AC
  Administered 2014-09-20: 650 mg via ORAL
  Filled 2014-09-20: qty 2

## 2014-09-20 MED ORDER — ONDANSETRON HCL 4 MG/2ML IJ SOLN
4.0000 mg | Freq: Once | INTRAMUSCULAR | Status: AC
  Administered 2014-09-20: 4 mg via INTRAVENOUS
  Filled 2014-09-20: qty 2

## 2014-09-20 MED ORDER — ACETAMINOPHEN 325 MG PO TABS: 650.0000 mg | ORAL_TABLET | Freq: Once | ORAL | Status: AC

## 2014-09-20 NOTE — ED Notes (Addendum)
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 Pt very lethargic. Reports feeling dizzy when she woke up this morning. Pt reports being treated yesterday at [facility] for UTI s/s that lasted approximately 3 days prior. Pt reports taking 4 doses of Cipro.

## 2014-09-20 NOTE — ED Provider Notes (Signed)
CSN: [id number]     Arrival date & time 09/20/14  0908 History   First MD Initiated Contact with Patient 09/20/14 [phone]     Chief Complaint  Patient presents with  . Loss of Consciousness     (Consider location/radiation/quality/duration/timing/severity/associated sxs/prior Treatment) Patient is a 49 y.o. female presenting with syncope. The history is provided by the patient. No language interpreter was used.  Loss of Consciousness Episode history:  Single Most recent episode:  Today Timing:  Rare Progression:  Unchanged Chronicity:  New Context comment:  Vomiting Witnessed: yes   Relieved by:  Nothing Worsened by:  Posture Ineffective treatments:  None tried Associated symptoms: confusion, diaphoresis, dizziness, malaise/fatigue, nausea, vomiting and weakness   Associated symptoms: no chest pain, no fever, no headaches, no palpitations, no recent fall, no recent injury and no shortness of breath   Associated symptoms comment:  Recent UTI   History reviewed. No pertinent past medical history. Past Surgical History  Procedure Laterality Date  . Cesarean section    . Tonsillectomy     History reviewed. No pertinent family history. History  Substance Use Topics  . Smoking status: Never Smoker   . Smokeless tobacco: Not on file  . Alcohol Use: Yes     Comment: occasional   OB History    No data available     Review of Systems  Constitutional: Positive for malaise/fatigue, diaphoresis and fatigue. Negative for fever, chills, activity change and appetite change.  HENT: Negative for congestion, facial swelling, rhinorrhea and sore throat.   Eyes: Negative for photophobia and discharge.  Respiratory: Negative for cough, chest tightness and shortness of breath.   Cardiovascular: Positive for syncope. Negative for chest pain, palpitations and leg swelling.  Gastrointestinal: Positive for nausea and vomiting. Negative for abdominal pain and diarrhea.  Endocrine: Negative for  polydipsia and polyuria.  Genitourinary: Negative for dysuria, frequency, difficulty urinating and pelvic pain.  Musculoskeletal: Negative for back pain, arthralgias, neck pain and neck stiffness.  Skin: Negative for color change and wound.  Allergic/Immunologic: Negative for immunocompromised state.  Neurological: Positive for dizziness and weakness. Negative for facial asymmetry, numbness and headaches.  Hematological: Does not bruise/bleed easily.  Psychiatric/Behavioral: Positive for confusion. Negative for agitation.      Allergies  Erythromycin; Peanut-containing drug products; and Tape  Home Medications   Prior to Admission medications   Medication Sig Start Date End Date Taking? Authorizing Provider  aspirin EC 81 MG tablet Take 162 mg by mouth daily.      Historical Provider, MD  cefpodoxime (VANTIN) 100 MG tablet Take 1 tablet (100 mg total) by mouth 2 (two) times daily. 09/20/14   Toy Cookey, MD  cyclobenzaprine (FLEXERIL) 10 MG tablet Take 1 tablet (10 mg total) by mouth 2 (two) times daily as needed for muscle spasms. 06/27/14   Arby Barrette, MD  ibuprofen (ADVIL,MOTRIN) 800 MG tablet Take 1 tablet (800 mg total) by mouth 3 (three) times daily. 06/27/14   Arby Barrette, MD  meloxicam (MOBIC) 15 MG tablet Take 15 mg by mouth daily.    Historical Provider, MD  oxyCODONE-acetaminophen (PERCOCET/ROXICET) 5-325 MG per tablet Take 2 tablets by mouth every 4 (four) hours as needed for severe pain. 06/27/14   Arby Barrette, MD  promethazine (PHENERGAN) 25 MG tablet Take 1 tablet (25 mg total) by mouth every 6 (six) hours as needed for nausea or vomiting. 09/20/14   Toy Cookey, MD   BP 102/28 mmHg  Pulse 71  Temp(Src) 98 F (36.7  C) (Oral)  Resp 18  Ht 5\' 6"  (1.676 m)  Wt 220 lb (99.791 kg)  BMI 35.53 kg/m2  SpO2 100% Physical Exam  Constitutional: She is oriented to person, place, and time. She appears well-developed and well-nourished. She appears listless. No  distress.  HENT:  Head: Normocephalic and atraumatic.  Mouth/Throat: No oropharyngeal exudate.  Eyes: Pupils are equal, round, and reactive to light.  Neck: Normal range of motion. Neck supple.  Cardiovascular: Normal rate, regular rhythm and normal heart sounds.  Exam reveals no gallop and no friction rub.   No murmur heard. Pulmonary/Chest: Effort normal and breath sounds normal. No respiratory distress. She has no wheezes. She has no rales.  Abdominal: Soft. Bowel sounds are normal. She exhibits no distension and no mass. There is no tenderness. There is no rebound and no guarding.  Musculoskeletal: Normal range of motion. She exhibits no edema or tenderness.  Neurological: She is oriented to person, place, and time. She has normal strength. She appears listless. She displays no atrophy and no tremor. No cranial nerve deficit or sensory deficit. She exhibits normal muscle tone. She displays no seizure activity. Gait abnormal. Coordination normal. GCS eye subscore is 4. GCS verbal subscore is 5. GCS motor subscore is 6.  She is not ataxic, though legs collapse with standing  Skin: Skin is warm and dry.  Psychiatric: She has a normal mood and affect.    ED Course  Procedures (including critical care time) Labs Review Labs Reviewed  COMPREHENSIVE METABOLIC PANEL - Abnormal; Notable for the following:    Potassium 3.4 (*)    Glucose, Bld 112 (*)    All other components within normal limits  URINALYSIS, ROUTINE W REFLEX MICROSCOPIC - Abnormal; Notable for the following:    APPearance TURBID (*)    Leukocytes, UA SMALL (*)    All other components within normal limits  URINE MICROSCOPIC-ADD ON - Abnormal; Notable for the following:    Squamous Epithelial / LPF FEW (*)    Bacteria, UA MANY (*)    All other components within normal limits  CBG MONITORING, ED - Abnormal; Notable for the following:    Glucose-Capillary 109 (*)    All other components within normal limits  URINE CULTURE   CBC WITH DIFFERENTIAL/PLATELET  I-STAT CG4 LACTIC ACID, ED    Imaging Review No results found.   EKG Interpretation   Date/Time:  Friday September 20 2014 09:28:45 EST Ventricular Rate:  68 PR Interval:  164 QRS Duration: 88 QT Interval:  432 QTC Calculation: 459 R Axis:   60 Text Interpretation:  Normal sinus rhythm with sinus arrhythmia Normal ECG  Confirmed by DOCHERTY  MD, MEGAN ([id number]) on 09/20/2014 9:40:33 AM      MDM   Final diagnoses:  Vasovagal syncope  UTI (lower urinary tract infection)    Pt is a 49 y.o. female with Pmhx as above who presents with syncopal episode at work in setting of UTI diagnosis 2 days ago.  She states when she got up this morning she felt dizzy, described as leaning.  At work, she suddenly became hot, nauseated, sweaty, vomited and then passed out.  She was lying on the floor and said it felt very good to lay on the floor.  She had a mild posterior headache this morning.  She's no headache currently.  She's had no chest pain, shortness of breath or abdominal pain.  She is no longer having dysuria.  On exam, she is intermittently listless, then  wide awake and alert.  She has no focal neuro findings, though upon standing, she cannot stay upright.      [facility] evaluation in the Emergency Department is complete. It has been determined that no acute conditions requiring further emergency intervention are present at this time. The patient/guardian have been advised of the diagnosis and plan. We have discussed signs and symptoms that warrant return to the ED, such as changes or worsening in symptoms, abdominal pain, chest pain, trouble breathing, further syncopal episodes, inability to tolerate liquids.       Toy Cookey, MD 09/20/14 [phone]

## 2014-09-20 NOTE — ED Notes (Signed)
Discharge delay due to MD awaiting Romeo AppleBen, Pharmacist, to confirm availability of prescription.

## 2014-09-20 NOTE — ED Notes (Signed)
Pt able to tolerated PO Ginger Ale with no N/V/D.

## 2014-09-20 NOTE — Discharge Instructions (Signed)
Syncope °Syncope is a medical term for fainting or passing out. This means you lose consciousness and drop to the ground. People are generally unconscious for less than 5 minutes. You may have some muscle twitches for up to 15 seconds before waking up and returning to normal. Syncope occurs more often in older adults, but it can happen to anyone. While most causes of syncope are not dangerous, syncope can be a sign of a serious medical problem. It is important to seek medical care.  °CAUSES  °Syncope is caused by a sudden drop in blood flow to the brain. The specific cause is often not determined. Factors that can bring on syncope include: °· Taking medicines that lower blood pressure. °· Sudden changes in posture, such as standing up quickly. °· Taking more medicine than prescribed. °· Standing in one place for too long. °· Seizure disorders. °· Dehydration and excessive exposure to heat. °· Low blood sugar (hypoglycemia). °· Straining to have a bowel movement. °· Heart disease, irregular heartbeat, or other circulatory problems. °· Fear, emotional distress, seeing blood, or severe pain. °SYMPTOMS  °Right before fainting, you may: °· Feel dizzy or light-headed. °· Feel nauseous. °· See all white or all black in your field of vision. °· Have cold, clammy skin. °DIAGNOSIS  °Your health care provider will ask about your symptoms, perform a physical exam, and perform an electrocardiogram (ECG) to record the electrical activity of your heart. Your health care provider may also perform other heart or blood tests to determine the cause of your syncope which may include: °· Transthoracic echocardiogram (TTE). During echocardiography, sound waves are used to evaluate how blood flows through your heart. °· Transesophageal echocardiogram (TEE). °· Cardiac monitoring. This allows your health care provider to monitor your heart rate and rhythm in real time. °· Holter monitor. This is a portable device that records your  heartbeat and can help diagnose heart arrhythmias. It allows your health care provider to track your heart activity for several days, if needed. °· Stress tests by exercise or by giving medicine that makes the heart beat faster. °TREATMENT  °In most cases, no treatment is needed. Depending on the cause of your syncope, your health care provider may recommend changing or stopping some of your medicines. °HOME CARE INSTRUCTIONS °· Have someone stay with you until you feel stable. °· Do not drive, use machinery, or play sports until your health care provider says it is okay. °· Keep all follow-up appointments as directed by your health care provider. °· Lie down right away if you start feeling like you might faint. Breathe deeply and steadily. Wait until all the symptoms have passed. °· Drink enough fluids to keep your urine clear or pale yellow. °· If you are taking blood pressure or heart medicine, get up slowly and take several minutes to sit and then stand. This can reduce dizziness. °SEEK IMMEDIATE MEDICAL CARE IF:  °· You have a severe headache. °· You have unusual pain in the chest, abdomen, or back. °· You are bleeding from your mouth or rectum, or you have black or tarry stool. °· You have an irregular or very fast heartbeat. °· You have pain with breathing. °· You have repeated fainting or seizure-like jerking during an episode. °· You faint when sitting or lying down. °· You have confusion. °· You have trouble walking. °· You have severe weakness. °· You have vision problems. °If you fainted, call your local emergency services (911 in [place] Do not drive   yourself to the hospital.  °MAKE SURE YOU: °· Understand these instructions. °· Will watch your condition. °· Will get help right away if you are not doing well or get worse. °Document Released: 08/02/2005 Document Revised: 08/07/2013 Document Reviewed: 10/01/2011 °[employer]® Patient Information ©2015 [employer]. This information is not intended to replace  advice given to you by your health care provider. Make sure you discuss any questions you have with your health care provider. ° °Urinary Tract Infection °Urinary tract infections (UTIs) can develop anywhere along your urinary tract. Your urinary tract is your body's drainage system for removing wastes and extra water. Your urinary tract includes two kidneys, two ureters, a bladder, and a urethra. Your kidneys are a pair of bean-shaped organs. Each kidney is about the size of your fist. They are located below your ribs, one on each side of your spine. °CAUSES °Infections are caused by microbes, which are microscopic organisms, including fungi, viruses, and bacteria. These organisms are so small that they can only be seen through a microscope. Bacteria are the microbes that most commonly cause UTIs. °SYMPTOMS  °Symptoms of UTIs may vary by age and gender of the patient and by the location of the infection. Symptoms in young women typically include a frequent and intense urge to urinate and a painful, burning feeling in the bladder or urethra during urination. Older women and men are more likely to be tired, shaky, and weak and have muscle aches and abdominal pain. A fever may mean the infection is in your kidneys. Other symptoms of a kidney infection include pain in your back or sides below the ribs, nausea, and vomiting. °DIAGNOSIS °To diagnose a UTI, your caregiver will ask you about your symptoms. Your caregiver also will ask to provide a urine sample. The urine sample will be tested for bacteria and white blood cells. White blood cells are made by your body to help fight infection. °TREATMENT  °Typically, UTIs can be treated with medication. Because most UTIs are caused by a bacterial infection, they usually can be treated with the use of antibiotics. The choice of antibiotic and length of treatment depend on your symptoms and the type of bacteria causing your infection. °HOME CARE INSTRUCTIONS °· If you were  prescribed antibiotics, take them exactly as your caregiver instructs you. Finish the medication even if you feel better after you have only taken some of the medication. °· Drink enough water and fluids to keep your urine clear or pale yellow. °· Avoid caffeine, tea, and carbonated beverages. They tend to irritate your bladder. °· Empty your bladder often. Avoid holding urine for long periods of time. °· Empty your bladder before and after sexual intercourse. °· After a bowel movement, women should cleanse from front to back. Use each tissue only once. °SEEK MEDICAL CARE IF:  °· You have back pain. °· You develop a fever. °· Your symptoms do not begin to resolve within 3 days. °SEEK IMMEDIATE MEDICAL CARE IF:  °· You have severe back pain or lower abdominal pain. °· You develop chills. °· You have nausea or vomiting. °· You have continued burning or discomfort with urination. °MAKE SURE YOU:  °· Understand these instructions. °· Will watch your condition. °· Will get help right away if you are not doing well or get worse. °Document Released: 05/12/2005 Document Revised: 02/01/2012 Document Reviewed: 09/10/2011 °Exit[employer]® Patient Information ©2015 [employer]. This information is not intended to replace advice given to you by your health care provider. Make sure   you discuss any questions you have with your health care provider. ° °

## 2014-09-20 NOTE — ED Notes (Signed)
MD at bedside. 

## 2014-09-21 LAB — URINE CULTURE
COLONY COUNT: NO GROWTH
Culture: NO GROWTH

## 2015-04-17 ENCOUNTER — Other Ambulatory Visit: Payer: Self-pay | Admitting: Obstetrics and Gynecology

## 2015-04-18 LAB — CYTOLOGY - PAP

## 2016-03-12 ENCOUNTER — Other Ambulatory Visit: Payer: Self-pay | Admitting: Internal Medicine

## 2016-03-12 ENCOUNTER — Ambulatory Visit
Admission: RE | Admit: 2016-03-12 | Discharge: 2016-03-12 | Disposition: A | Discharge status: Home / Self Care | Payer: BC Managed Care – PPO | Source: Ambulatory Visit | Attending: Internal Medicine | Admitting: Internal Medicine

## 2016-03-12 DIAGNOSIS — S99922A Unspecified injury of left foot, initial encounter: Secondary | ICD-10-CM

## 2021-07-14 ENCOUNTER — Other Ambulatory Visit (HOSPITAL_BASED_OUTPATIENT_CLINIC_OR_DEPARTMENT_OTHER): Payer: Self-pay

## 2021-07-14 ENCOUNTER — Ambulatory Visit: Payer: BC Managed Care – PPO | Attending: Internal Medicine

## 2021-07-14 DIAGNOSIS — Z23 Encounter for immunization: Secondary | ICD-10-CM

## 2021-07-14 MED ORDER — FLUARIX QUADRIVALENT 0.5 ML IM SUSY
PREFILLED_SYRINGE | INTRAMUSCULAR | 0 refills | Status: AC
  Filled 2021-07-14: qty 0.5, 1d supply, fill #0

## 2021-07-14 NOTE — Progress Notes (Signed)
   [facility]  Name:  [name]    MRN: [id number] DOB: [birth year]  07/14/2021  [name] was observed post Covid-19 immunization for 15 minutes without incident. She was provided with Vaccine Information Sheet and instruction to access the V-Safe system.   [name] was instructed to call 911 with any severe reactions post vaccine: Difficulty breathing  Swelling of face and throat  A fast heartbeat  A bad rash all over body  Dizziness and weakness   Immunizations Administered     Name Date Dose VIS Date Route   Pfizer Covid-19 Vaccine Bivalent Booster 07/14/2021 11:53 AM 0.3 mL 04/15/2021 Intramuscular   Manufacturer: [employer], [employer]   Lot: [id number]   NDC: [phone]

## 2021-07-18 ENCOUNTER — Other Ambulatory Visit (HOSPITAL_BASED_OUTPATIENT_CLINIC_OR_DEPARTMENT_OTHER): Payer: Self-pay

## 2021-07-18 MED ORDER — PFIZER COVID-19 VAC BIVALENT 30 MCG/0.3ML IM SUSP
INTRAMUSCULAR | 0 refills | Status: AC
  Filled 2021-07-18: qty 0.3, 1d supply, fill #0

## 2021-07-20 ENCOUNTER — Other Ambulatory Visit (HOSPITAL_BASED_OUTPATIENT_CLINIC_OR_DEPARTMENT_OTHER): Payer: Self-pay

## 2024-02-14 ENCOUNTER — Other Ambulatory Visit (HOSPITAL_BASED_OUTPATIENT_CLINIC_OR_DEPARTMENT_OTHER): Payer: Self-pay | Admitting: Physician Assistant

## 2024-02-14 DIAGNOSIS — E7800 Pure hypercholesterolemia, unspecified: Secondary | ICD-10-CM

## 2024-03-14 ENCOUNTER — Ambulatory Visit (HOSPITAL_BASED_OUTPATIENT_CLINIC_OR_DEPARTMENT_OTHER)
Admission: RE | Admit: 2024-03-14 | Discharge: 2024-03-14 | Disposition: A | Discharge status: Home / Self Care | Payer: Self-pay | Source: Ambulatory Visit | Attending: Physician Assistant | Admitting: Physician Assistant

## 2024-03-14 DIAGNOSIS — E7800 Pure hypercholesterolemia, unspecified: Secondary | ICD-10-CM | POA: Insufficient documentation
